# Patient Record
Sex: Female | Born: 1990 | Race: Black or African American | Hispanic: No | Marital: Single | Smoking: Never smoker
Health system: Southern US, Community
[De-identification: ages and names within clinical notes are randomized; demographics above are authoritative.]

## PROBLEM LIST (undated history)

## (undated) DIAGNOSIS — K219 Gastro-esophageal reflux disease without esophagitis: Secondary | ICD-10-CM

## (undated) HISTORY — DX: Gastro-esophageal reflux disease without esophagitis: K21.9

---

## 2013-01-12 ENCOUNTER — Emergency Department (HOSPITAL_COMMUNITY)
Admission: EM | Admit: 2013-01-12 | Discharge: 2013-01-13 | Disposition: A | Discharge status: Home / Self Care | Payer: Federal, State, Local not specified - PPO | Attending: Emergency Medicine | Admitting: Emergency Medicine

## 2013-01-12 ENCOUNTER — Encounter (HOSPITAL_COMMUNITY): Payer: Self-pay | Admitting: *Deleted

## 2013-01-12 DIAGNOSIS — B9689 Other specified bacterial agents as the cause of diseases classified elsewhere: Secondary | ICD-10-CM

## 2013-01-12 DIAGNOSIS — N760 Acute vaginitis: Secondary | ICD-10-CM

## 2013-01-12 DIAGNOSIS — Z3202 Encounter for pregnancy test, result negative: Secondary | ICD-10-CM | POA: Insufficient documentation

## 2013-01-12 DIAGNOSIS — B373 Candidiasis of vulva and vagina: Secondary | ICD-10-CM | POA: Insufficient documentation

## 2013-01-12 DIAGNOSIS — N3941 Urge incontinence: Secondary | ICD-10-CM | POA: Insufficient documentation

## 2013-01-12 DIAGNOSIS — N390 Urinary tract infection, site not specified: Secondary | ICD-10-CM

## 2013-01-12 DIAGNOSIS — R350 Frequency of micturition: Secondary | ICD-10-CM | POA: Insufficient documentation

## 2013-01-12 DIAGNOSIS — B3731 Acute candidiasis of vulva and vagina: Secondary | ICD-10-CM

## 2013-01-12 LAB — WET PREP, GENITAL: Trich, Wet Prep: NONE SEEN

## 2013-01-12 LAB — URINALYSIS, ROUTINE W REFLEX MICROSCOPIC
Bilirubin Urine: NEGATIVE
Ketones, ur: 40 mg/dL — AB
Nitrite: NEGATIVE
Protein, ur: NEGATIVE mg/dL
Specific Gravity, Urine: 1.022 (ref 1.005–1.030)
Urobilinogen, UA: 0.2 mg/dL (ref 0.0–1.0)

## 2013-01-12 NOTE — ED Notes (Signed)
The pt has had a rash on her vagina and or her labia for one month intermittently.  No itching  Some discomfort .  lmp  March  31st

## 2013-01-12 NOTE — ED Notes (Signed)
Pt reports perineal rash since this past Monday. States that she applied monistat cream and took Ibuprofen for the burning sensation. Denies pain at this time. Reports that she had same type of rash with her menstrual cycle last month and is about to start her menstrual cycle this week. Denies dysuria, abdominal pain, nausea, vomiting, diarrhea. Reports clear vaginal discharge.

## 2013-01-13 MED ORDER — PHENAZOPYRIDINE HCL 200 MG PO TABS: 200.0000 mg | ORAL_TABLET | Freq: Three times a day (TID) | ORAL | Status: DC

## 2013-01-13 MED ORDER — FLUCONAZOLE 100 MG PO TABS
150.0000 mg | ORAL_TABLET | Freq: Once | ORAL | Status: AC
  Administered 2013-01-13: 150 mg via ORAL
  Filled 2013-01-13: qty 2

## 2013-01-13 MED ORDER — METRONIDAZOLE 500 MG PO TABS: 500.0000 mg | ORAL_TABLET | Freq: Two times a day (BID) | ORAL | Status: DC

## 2013-01-13 MED ORDER — CIPROFLOXACIN HCL 250 MG PO TABS: 250.0000 mg | ORAL_TABLET | Freq: Two times a day (BID) | ORAL | Status: DC

## 2013-01-13 NOTE — ED Provider Notes (Signed)
History     CSN: [id number]  Arrival date & time 01/12/13  2032   First MD Initiated Contact with Patient 01/12/13 2128      Chief Complaint  Patient presents with  . Rash    (Consider location/radiation/quality/duration/timing/severity/associated sxs/prior treatment) HPI Patient presents w cc vaginal discomfort. She states that her vulva is very irritated and it burns when she urinates or walks. She has also had frequency and urgency of urination without hmaturia or flank pain. She states that it is uncomfortable and distressing . She denie any vaginal discharge, itching, burning, foul odor or pain. Denies unprotected sex. Denies fevers, chills, myalgias, arthralgias. Denies DOE, SOB, chest tightness or pressure, radiation to left arm, jaw or back, or diaphoresis. Denies headaches, light headedness, weakness, visual disturbances. Denies abdominal pain, nausea, vomiting, diarrhea or constipation.    History reviewed. No pertinent past medical history.  History reviewed. No pertinent past surgical history.  No family history on file.  History  Substance Use Topics  . Smoking status: Never Smoker   . Smokeless tobacco: Not on file  . Alcohol Use: No    OB History   Grav Para Term Preterm Abortions TAB SAB Ect Mult Living                  Review of Systems Ten systems reviewed and are negative for acute change, except as noted in the HPI.   Allergies  Review of patient's allergies indicates not on file.  Home Medications   Current Outpatient Rx  Name  Route  Sig  Dispense  Refill  . ibuprofen (ADVIL,MOTRIN) 200 MG tablet   Oral   Take 400 mg by mouth every 8 (eight) hours as needed for pain.         . miconazole (MICOTIN) 2 % cream   Topical   Apply 1 application topically 2 (two) times daily.           BP 142/79  Pulse 105  Temp(Src) 99 F (37.2 C) (Oral)  Resp 20  Ht 5' 5.5" (1.664 m)  Wt 215 lb (97.523 kg)  BMI 35.22 kg/m2  SpO2 100%  LMP  12/12/2012  Physical Exam  Constitutional: She is oriented to person, place, and time. She appears well-developed and well-nourished. No distress.  HENT:  Head: Normocephalic and atraumatic.  Eyes: Conjunctivae are normal. No scleral icterus.  Neck: Normal range of motion.  Cardiovascular: Normal rate, regular rhythm and normal heart sounds.  Exam reveals no gallop and no friction rub.   No murmur heard. Pulmonary/Chest: Effort normal and breath sounds normal. No respiratory distress.  Abdominal: Soft. Bowel sounds are normal. She exhibits no distension and no mass. There is no tenderness. There is no guarding.  Genitourinary:    Pelvic exam was performed with patient supine. No labial fusion. There is tenderness on the right labia. There is no rash, lesion or injury on the right labia. There is tenderness on the left labia. There is no rash, lesion or injury on the left labia. No erythema, tenderness or bleeding around the vagina. No foreign body around the vagina. No signs of injury around the vagina. Vaginal discharge found.  Neurological: She is alert and oriented to person, place, and time.  Skin: Skin is warm and dry. She is not diaphoretic.    ED Course  Procedures (including critical care time)  Labs Reviewed  WET PREP, GENITAL - Abnormal; Notable for the following:    Yeast Wet Prep HPF POC  RARE (*)    Clue Cells Wet Prep HPF POC MANY (*)    WBC, Wet Prep HPF POC FEW (*)    All other components within normal limits  URINALYSIS, ROUTINE W REFLEX MICROSCOPIC - Abnormal; Notable for the following:    APPearance CLOUDY (*)    Hgb urine dipstick TRACE (*)    Ketones, ur 40 (*)    Leukocytes, UA TRACE (*)    All other components within normal limits  URINE MICROSCOPIC-ADD ON - Abnormal; Notable for the following:    Squamous Epithelial / LPF FEW (*)    Bacteria, UA FEW (*)    All other components within normal limits  GC/CHLAMYDIA PROBE AMP  PREGNANCY, URINE   No results  found.   1. UTI (lower urinary tract infection)   2. Vulvovaginitis   3. BV (bacterial vaginosis)   4. Yeast infection of the vagina       MDM  Patient labs show uti, bv/yeast infectin. I do not suspect any PID.  No cervicitis and nontender on exam. G/c chlamydia pending. patietn tachycardicinitially. States she is very nervous. HR trending down. The patient appears reasonably screened and/or stabilized for discharge and I doubt any other medical condition or other [facility] requiring further screening, evaluation, or treatment in the ED at this time prior to discharge. F/u with Lajean Saver, PA-C 01/15/13 2124

## 2013-01-14 LAB — GC/CHLAMYDIA PROBE AMP
CT Probe RNA: NEGATIVE
GC Probe RNA: NEGATIVE

## 2013-01-16 NOTE — ED Provider Notes (Signed)
Medical screening examination/treatment/procedure(s) were performed by non-physician practitioner and as supervising physician I was immediately available for consultation/collaboration.   Carleene Cooper III, MD 01/16/13 [phone]

## 2014-01-02 ENCOUNTER — Ambulatory Visit (INDEPENDENT_AMBULATORY_CARE_PROVIDER_SITE_OTHER): Payer: Federal, State, Local not specified - PPO | Admitting: Family Medicine

## 2014-01-02 ENCOUNTER — Ambulatory Visit: Payer: Federal, State, Local not specified - PPO

## 2014-01-02 VITALS — BP 120/68 | HR 91 | Temp 98.7°F | Resp 18 | Ht 64.5 in | Wt 231.0 lb

## 2014-01-02 DIAGNOSIS — K219 Gastro-esophageal reflux disease without esophagitis: Secondary | ICD-10-CM

## 2014-01-02 DIAGNOSIS — R109 Unspecified abdominal pain: Secondary | ICD-10-CM

## 2014-01-02 DIAGNOSIS — R1011 Right upper quadrant pain: Secondary | ICD-10-CM

## 2014-01-02 LAB — POCT URINALYSIS DIPSTICK
Bilirubin, UA: NEGATIVE
Blood, UA: NEGATIVE
Glucose, UA: NEGATIVE
Ketones, UA: NEGATIVE
Leukocytes, UA: NEGATIVE
Nitrite, UA: NEGATIVE
Protein, UA: NEGATIVE
Spec Grav, UA: 1.01
Urobilinogen, UA: 0.2
pH, UA: 5.5

## 2014-01-02 LAB — POCT CBC
Granulocyte percent: 53 %G (ref 37–80)
HCT, POC: 41.8 % (ref 37.7–47.9)
Hemoglobin: 13.3 g/dL (ref 12.2–16.2)
Lymph, poc: 2.9 (ref 0.6–3.4)
MCH, POC: 25.3 pg — AB (ref 27–31.2)
MCHC: 31.8 g/dL (ref 31.8–35.4)
MCV: 79.5 fL — AB (ref 80–97)
MID (cbc): 0.7 (ref 0–0.9)
MPV: 8.2 fL (ref 0–99.8)
POC Granulocyte: 4 (ref 2–6.9)
POC LYMPH PERCENT: 37.7 % (ref 10–50)
POC MID %: 9.3 % (ref 0–12)
Platelet Count, POC: 410 10*3/uL (ref 142–424)
RBC: 5.26 M/uL (ref 4.04–5.48)
RDW, POC: 15 %
WBC: 7.6 10*3/uL (ref 4.6–10.2)

## 2014-01-02 LAB — POCT UA - MICROSCOPIC ONLY
Bacteria, U Microscopic: NEGATIVE
Casts, Ur, LPF, POC: NEGATIVE
Crystals, Ur, HPF, POC: NEGATIVE
Mucus, UA: NEGATIVE
RBC, urine, microscopic: NEGATIVE
Yeast, UA: NEGATIVE

## 2014-01-02 MED ORDER — PANTOPRAZOLE SODIUM 20 MG PO TBEC: 20.0000 mg | DELAYED_RELEASE_TABLET | Freq: Every day | ORAL | Status: DC

## 2014-01-02 NOTE — Patient Instructions (Signed)

## 2014-01-02 NOTE — Progress Notes (Signed)
Chief Complaint:  Chief Complaint  Patient presents with  . Abdominal Pain    1 week- no diarrhea no vomitng    HPI: Heather Mccarthy is a 23 y.o. female who is here for 1 week history of ruq abd discomfort , mostly at night. She has had similar symptoms in the past, in October.  She has also been told she had GERD. RUQ abd pain does not hurt but  It is uncomfortable if she is laying on the right side and on her stomach. Denies any diarrhea,  nausea, vomiting, urinary  sxs. Not assoicated with food, Does not matter what foods she eats. Denies and y fevers or chills. Does not feel like a urinary tract infection, deneis vaginal dc, STDs. She drinks alcohol mostly on the weekneds, 2 mix drinks She denies having high cholesterol but has not checked. No family history of gallstones. Denies any blood in urine or stool Deneis any new foods, meds, or recent travels , no abd surgeries Oral intake has been normal, denies any h/o ulcers, renal stones. Has had normal BM this AM, has had gas  Past Medical History  Diagnosis Date  . GERD (gastroesophageal reflux disease)    No past surgical history on file. History   Social History  . Marital Status: Single    Spouse Name: N/A    Number of Children: N/A  . Years of Education: N/A   Social History Main Topics  . Smoking status: Never Smoker   . Smokeless tobacco: None  . Alcohol Use: 1.0 oz/week    2 drink(s) per week  . Drug Use: No  . Sexual Activity: None   Other Topics Concern  . None   Social History Narrative  . None   Family History  Problem Relation Age of Onset  . Hyperlipidemia Father    No Known Allergies Prior to Admission medications   Medication Sig Start Date End Date Taking? Authorizing Provider  ibuprofen (ADVIL,MOTRIN) 200 MG tablet Take 400 mg by mouth every 8 (eight) hours as needed for pain.    Historical Provider, MD     ROS: The patient denies fevers, chills, night sweats, unintentional weight loss,  chest pain, palpitations, wheezing, dyspnea on exertion, nausea, vomiting,  dysuria, hematuria, melena, numbness, weakness, or tingling.   All other systems have been reviewed and were otherwise negative with the exception of those mentioned in the HPI and as above.    PHYSICAL EXAM: Filed Vitals:   01/02/14 1833  BP: 120/68  Pulse: 91  Temp: 98.7 F (37.1 C)  Resp: 18   Filed Vitals:   01/02/14 1833  Height: 5' 4.5" (1.638 m)  Weight: 231 lb (104.781 kg)   Body mass index is 39.05 kg/(m^2).  General: Alert, no acute distress HEENT:  Normocephalic, atraumatic, oropharynx patent. EOMI, PERRLA Cardiovascular:  Regular rate and rhythm, no rubs murmurs or gallops.  No Carotid bruits, radial pulse intact. No pedal edema.  Respiratory: Clear to auscultation bilaterally.  No wheezes, rales, or rhonchi.  No cyanosis, no use of accessory musculature GI: No organomegaly, abdomen is soft and non-tender, positive bowel sounds.  No masses. Non acute abd Skin: No rashes. Neurologic: Facial musculature symmetric. Psychiatric: Patient is appropriate throughout our interaction. Lymphatic: No cervical lymphadenopathy Musculoskeletal: Gait intact.   LABS: Results for orders placed in visit on 01/02/14  POCT CBC      Result Value Ref Range   WBC 7.6  4.6 - 10.2 K/uL  Lymph, poc 2.9  0.6 - 3.4   POC LYMPH PERCENT 37.7  10 - 50 %L   MID (cbc) 0.7  0 - 0.9   POC MID % 9.3  0 - 12 %M   POC Granulocyte 4.0  2 - 6.9   Granulocyte percent 53.0  37 - 80 %G   RBC 5.26  4.04 - 5.48 M/uL   Hemoglobin 13.3  12.2 - 16.2 g/dL   HCT, POC 28.441.8  13.237.7 - 47.9 %   MCV 79.5 (*) 80 - 97 fL   MCH, POC 25.3 (*) 27 - 31.2 pg   MCHC 31.8  31.8 - 35.4 g/dL   RDW, POC 44.015.0     Platelet Count, POC 410  142 - 424 K/uL   MPV 8.2  0 - 99.8 fL  POCT UA - MICROSCOPIC ONLY      Result Value Ref Range   WBC, Ur, HPF, POC 0-1     RBC, urine, microscopic neg     Bacteria, U Microscopic neg     Mucus, UA neg      Epithelial cells, urine per micros 0-1     Crystals, Ur, HPF, POC neg     Casts, Ur, LPF, POC neg     Yeast, UA neg    POCT URINALYSIS DIPSTICK      Result Value Ref Range   Color, UA yellow     Clarity, UA clear     Glucose, UA neg     Bilirubin, UA neg     Ketones, UA neg     Spec Grav, UA 1.010     Blood, UA neg     pH, UA 5.5     Protein, UA neg     Urobilinogen, UA 0.2     Nitrite, UA neg     Leukocytes, UA Negative       EKG/XRAY:   [facility] Neg xray   ASSESSMENT/PLAN: Encounter Diagnoses  Name Primary?  . Abdominal pain, right upper quadrant Yes  . Flank pain    Will wait for CMP, lipase GERD sxs precautions given, Rx protonix Acute abd abdomen precautions given Rx Protonix since tried otc zantac and prilosec without relief Will consider RUQ US if labs are negative, if sxs persist. I am not sure if this is a GB issues since it is primarily at night when she sleeps and also when she sleeps on her stomach. POssibly GERD vs msk in origin F/u with labs   Gross sideeffects, risk and benefits, and alternatives of medications d/w patient. Patient is aware that all medications have potential sideeffects and we are unable to predict every sideeffect or drug-drug interaction that may occur.  Lenell Antuhao P Le, DO 01/02/2014 8:12 PM

## 2014-01-03 LAB — COMPREHENSIVE METABOLIC PANEL WITH GFR
AST: 16 U/L (ref 0–37)
Alkaline Phosphatase: 89 U/L (ref 39–117)
BUN: 8 mg/dL (ref 6–23)
Glucose, Bld: 86 mg/dL (ref 70–99)
Sodium: 134 meq/L — ABNORMAL LOW (ref 135–145)
Total Bilirubin: 0.4 mg/dL (ref 0.2–1.2)

## 2014-01-03 LAB — COMPREHENSIVE METABOLIC PANEL
ALT: 19 U/L (ref 0–35)
Albumin: 4.2 g/dL (ref 3.5–5.2)
CO2: 24 mEq/L (ref 19–32)
Calcium: 9.6 mg/dL (ref 8.4–10.5)
Chloride: 99 mEq/L (ref 96–112)
Creat: 0.68 mg/dL (ref 0.50–1.10)
Potassium: 3.7 mEq/L (ref 3.5–5.3)
Total Protein: 7.8 g/dL (ref 6.0–8.3)

## 2014-01-03 LAB — LIPASE: Lipase: <10 U/L (ref 0–75)

## 2014-01-06 ENCOUNTER — Telehealth: Payer: Self-pay | Admitting: Family Medicine

## 2014-01-06 NOTE — Telephone Encounter (Signed)
LM re: labs

## 2014-01-07 ENCOUNTER — Telehealth: Payer: Self-pay

## 2014-01-07 NOTE — Telephone Encounter (Signed)
Spoke with patient about labs, she is better on protonix, feels much better. If she still has pain then will get abd US , gave her precautions for RUQ pain with GB disorder.

## 2014-01-07 NOTE — Telephone Encounter (Signed)
PT STATES DR LE JUST CALLED HER AND SHE HAVE ANOTHER QUESTION TO ASK. PLEASE CALL [phone]

## 2014-01-07 NOTE — Telephone Encounter (Signed)
FYI Pt was wondering about taking a OTC stool softener to help with her constipation. Advised her that would be fine as long as used in moderation. She should not use them long term. Suggested increase water and exercise to help with the motility. Pt understands and will call us if she has any further concerns.

## 2014-03-22 ENCOUNTER — Ambulatory Visit (INDEPENDENT_AMBULATORY_CARE_PROVIDER_SITE_OTHER): Payer: Federal, State, Local not specified - PPO | Admitting: Physician Assistant

## 2014-03-22 VITALS — BP 126/66 | HR 91 | Temp 99.0°F | Resp 18 | Ht 66.0 in | Wt 234.4 lb

## 2014-03-22 DIAGNOSIS — N898 Other specified noninflammatory disorders of vagina: Secondary | ICD-10-CM

## 2014-03-22 DIAGNOSIS — L293 Anogenital pruritus, unspecified: Secondary | ICD-10-CM

## 2014-03-22 LAB — POCT WET PREP WITH KOH
CLUE CELLS WET PREP PER HPF POC: NEGATIVE
KOH Prep POC: NEGATIVE
TRICHOMONAS UA: NEGATIVE
YEAST WET PREP PER HPF POC: NEGATIVE

## 2014-03-22 MED ORDER — METRONIDAZOLE 500 MG PO TABS: 500.0000 mg | ORAL_TABLET | Freq: Two times a day (BID) | ORAL | Status: DC

## 2014-03-22 NOTE — Progress Notes (Signed)
Subjective:    Patient ID: Heather Mccarthy, female    DOB: [birth year], 23 y.o.   MRN: [id number]  HPI   Heather Mccarthy is a very pleasant 23 yr old female here for vaginal discomfort and itching.  This started 4 days ago.  She denies discharge or odor.  Reports she is prone to yeast infections and has had BV in the past - this feels like prior episodes of BV.  She denies abd pain, NV, FC, or urinary symptoms.  She is currently sexually active with 1 female partner.  She reports 100% condom use.  She was previously on OCPs but just stopped taking them - she is not sure why.  She does have refills left on her OCP.  She is not trying to conceive.  LMP 02/26/14.  She has no concern for STI and declines testing today.   Review of Systems  Constitutional: Negative for fever and chills.  Respiratory: Negative.   Cardiovascular: Negative.   Gastrointestinal: Negative for nausea, vomiting and abdominal pain.  Genitourinary: Negative for dysuria, frequency, hematuria, flank pain, vaginal discharge, menstrual problem and dyspareunia.  Musculoskeletal: Negative.   Skin: Negative.        Objective:   Physical Exam  Vitals reviewed. Constitutional: She is oriented to person, place, and time. She appears well-developed and well-nourished. No distress.  HENT:  Head: Normocephalic and atraumatic.  Eyes: Conjunctivae are normal. No scleral icterus.  Cardiovascular: Normal rate, regular rhythm and normal heart sounds.   Pulmonary/Chest: Effort normal and breath sounds normal. She has no wheezes. She has no rales.  Abdominal: Soft. Bowel sounds are normal. There is no tenderness.  Genitourinary: There is no rash, tenderness or lesion on the right labia. There is no rash, tenderness or lesion on the left labia. Cervix exhibits no motion tenderness. Right adnexum displays no mass, no tenderness and no fullness. Left adnexum displays no mass, no tenderness and no fullness. Vaginal discharge (moderate amount of  white/yellow discharge) found.  Vaginal odor present  Neurological: She is alert and oriented to person, place, and time.  Skin: Skin is warm and dry.  Psychiatric: She has a normal mood and affect. Her behavior is normal.    Results for orders placed in visit on 03/22/14  POCT WET PREP WITH KOH      Result Value Ref Range   Trichomonas, UA Negative     Clue Cells Wet Prep HPF POC neg     Epithelial Wet Prep HPF POC 1-4     Yeast Wet Prep HPF POC neg     Bacteria Wet Prep HPF POC 3+     RBC Wet Prep HPF POC 0-5     WBC Wet Prep HPF POC 4-10     KOH Prep POC Negative         Assessment & Plan:  Vaginal itching - Plan: POCT Wet Prep with KOH, GC/Chlamydia Probe Amp, metroNIDAZOLE (FLAGYL) 500 MG tablet   Heather Mccarthy is a very pleasant 23 yr old female here with 4 days of vaginal discomfort/itching.  History of BV and yeast.  Wet prep today is neg for clues - but there is 3+ bacteria, and 4-10 leuks.  Because of this I did recommend gc/chlamydia testing to pt.  She had initially declined, but later agreed to testing - a self collected genprobe has been sent.  Given that pt feels similar to previous episodes of BV, will treat empirically with Flagyl.  Will adjust treatment if necessary  based on gc/chlamydia testing  Encourage pt to restart OCP and gave her edu materials on various birth control methods  Pt to call or RTC if worsening or not improving  E. Frances FurbishElizabeth Egan MHS, PA-C [facility] 6/27/20152:17 PM

## 2014-03-22 NOTE — Patient Instructions (Signed)
Take the metronidazole (Flagyl) as directed.  DO NOT DRINK ALCOHOL WHILE TAKING THIS MEDICATION or for 48 hours after your last dose.  I will let you know when the other labs are back and if we need to do anything else based on those  Please let me know if you are worsening or not improving  Consider restarting your birth control to prevent pregnancy    Bacterial Vaginosis Bacterial vaginosis is a vaginal infection that occurs when the normal balance of bacteria in the vagina is disrupted. It results from an overgrowth of certain bacteria. This is the most common vaginal infection in women of childbearing age. Treatment is important to prevent complications, especially in pregnant women, as it can cause a premature delivery. CAUSES  Bacterial vaginosis is caused by an increase in harmful bacteria that are normally present in smaller amounts in the vagina. Several different kinds of bacteria can cause bacterial vaginosis. However, the reason that the condition develops is not fully understood. RISK FACTORS Certain activities or behaviors can put you at an increased risk of developing bacterial vaginosis, including:  Having a new sex partner or multiple sex partners.  Douching.  Using an intrauterine device (IUD) for contraception. Women do not get bacterial vaginosis from toilet seats, bedding, swimming pools, or contact with objects around them. SIGNS AND SYMPTOMS  Some women with bacterial vaginosis have no signs or symptoms. Common symptoms include:  Grey vaginal discharge.  A fishlike odor with discharge, especially after sexual intercourse.  Itching or burning of the vagina and vulva.  Burning or pain with urination. DIAGNOSIS  Your health care Spike Desilets will take a medical history and examine the vagina for signs of bacterial vaginosis. A sample of vaginal fluid may be taken. Your health care Gregor Dershem will look at this sample under a microscope to check for bacteria and abnormal  cells. A vaginal pH test may also be done.  TREATMENT  Bacterial vaginosis may be treated with antibiotic medicines. These may be given in the form of a pill or a vaginal cream. A second round of antibiotics may be prescribed if the condition comes back after treatment.  HOME CARE INSTRUCTIONS   Only take over-the-counter or prescription medicines as directed by your health care Johathon Overturf.  If antibiotic medicine was prescribed, take it as directed. Make sure you finish it even if you start to feel better.  Do not have sex until treatment is completed.  Tell all sexual partners that you have a vaginal infection. They should see their health care Endora Teresi and be treated if they have problems, such as a mild rash or itching.  Practice safe sex by using condoms and only having one sex partner. SEEK MEDICAL CARE IF:   Your symptoms are not improving after 3 days of treatment.  You have increased discharge or pain.  You have a fever. MAKE SURE YOU:   Understand these instructions.  Will watch your condition.  Will get help right away if you are not doing well or get worse. FOR MORE INFORMATION  Centers for Disease Control and Prevention, Division of STD Prevention: SolutionApps.co.zawww.cdc.gov/std American Sexual Health Association (ASHA): www.ashastd.org  Document Released: 09/12/2005 Document Revised: 07/03/2013 Document Reviewed: 04/24/2013 [facility] Patient Information 2015 [place], [place]. This information is not intended to replace advice given to you by your health care Whit Bruni. Make sure you discuss any questions you have with your health care Corrina Steffensen.

## 2014-03-24 LAB — GC/CHLAMYDIA PROBE AMP
CT Probe RNA: POSITIVE — AB
GC Probe RNA: NEGATIVE

## 2014-03-25 ENCOUNTER — Other Ambulatory Visit: Payer: Self-pay | Admitting: Physician Assistant

## 2014-03-25 MED ORDER — AZITHROMYCIN 500 MG PO TABS: 1000.0000 mg | ORAL_TABLET | Freq: Once | ORAL | Status: DC

## 2014-03-26 ENCOUNTER — Telehealth: Payer: Self-pay

## 2014-03-26 NOTE — Telephone Encounter (Signed)
Advised pt that she could stop 1st medication. She understands

## 2014-03-26 NOTE — Telephone Encounter (Signed)
PT HAVE QUESTIONS REGARDING THE MEDICATION SHE WAS PUT ON, THEY HAD HER ON SOMETHING AND THEN CHANGED IT ONCE HER TEST RESULTS CAME BACK DIDN'T KNOW IF SHE SHOULD CONTINUE WITH THEM BOTH OR JUST THE LAST ONE. PLEASE CALL [phone]

## 2014-05-21 ENCOUNTER — Ambulatory Visit (INDEPENDENT_AMBULATORY_CARE_PROVIDER_SITE_OTHER): Payer: Federal, State, Local not specified - PPO | Admitting: Physician Assistant

## 2014-05-21 VITALS — BP 120/68 | HR 83 | Temp 98.6°F | Resp 16 | Ht 66.5 in | Wt 224.2 lb

## 2014-05-21 DIAGNOSIS — N899 Noninflammatory disorder of vagina, unspecified: Secondary | ICD-10-CM

## 2014-05-21 DIAGNOSIS — R350 Frequency of micturition: Secondary | ICD-10-CM

## 2014-05-21 DIAGNOSIS — N898 Other specified noninflammatory disorders of vagina: Secondary | ICD-10-CM

## 2014-05-21 DIAGNOSIS — K219 Gastro-esophageal reflux disease without esophagitis: Secondary | ICD-10-CM | POA: Insufficient documentation

## 2014-05-21 DIAGNOSIS — A499 Bacterial infection, unspecified: Secondary | ICD-10-CM

## 2014-05-21 DIAGNOSIS — N760 Acute vaginitis: Secondary | ICD-10-CM

## 2014-05-21 DIAGNOSIS — B9689 Other specified bacterial agents as the cause of diseases classified elsewhere: Secondary | ICD-10-CM

## 2014-05-21 LAB — POCT WET PREP WITH KOH
KOH Prep POC: NEGATIVE
TRICHOMONAS UA: NEGATIVE
YEAST WET PREP PER HPF POC: NEGATIVE

## 2014-05-21 LAB — POCT URINALYSIS DIPSTICK
BILIRUBIN UA: NEGATIVE
GLUCOSE UA: NEGATIVE
KETONES UA: NEGATIVE
Nitrite, UA: NEGATIVE
Protein, UA: 30
SPEC GRAV UA: 1.02
Urobilinogen, UA: 0.2
pH, UA: 6

## 2014-05-21 LAB — POCT UA - MICROSCOPIC ONLY
Casts, Ur, LPF, POC: NEGATIVE
Crystals, Ur, HPF, POC: NEGATIVE
MUCUS UA: NEGATIVE
YEAST UA: NEGATIVE

## 2014-05-21 MED ORDER — METRONIDAZOLE 500 MG PO TABS: 500.0000 mg | ORAL_TABLET | Freq: Two times a day (BID) | ORAL | Status: DC

## 2014-05-21 NOTE — Progress Notes (Signed)
Subjective:    Patient ID: Heather Mccarthy, female    DOB: 1991/06/30, 23 y.o.   MRN: [id number]   PCP: No PCP Per Patient  Chief Complaint  Patient presents with  . Other    pt states for at least one month she has been having some discomfort around the labia and back towards the buttocks; OTC itch cream with little effect; denies fever/chills and no other sxs    Medications, allergies, past medical history, surgical history, family history, social history and problem list reviewed and updated.  Patient Active Problem List   Diagnosis Date Noted  . GERD (gastroesophageal reflux disease) 05/21/2014    Prior to Admission medications   Medication Sig Start Date End Date Taking? Authorizing Provider  pantoprazole (PROTONIX) 20 MG tablet Take 1 tablet (20 mg total) by mouth daily. 01/02/14  Yes Thao P Le, DO    HPI  Irritation, buttock, "feels like a diaper rash." "I've had a UTI before, that's what it kind of reminds me of." "I have hemorrhoids, too."  "About a month and a half ago I had a bump, and then I shaved, and it's been there ever since.  No vaginal discharge. No burning with urination. "When exposed to the air, it feels like my skin is kind of cracking." Mild increase in urinary frequency, no urgency. No vaginal discharge.  Review of Systems As above.    Objective:   Physical Exam  Constitutional: She is oriented to person, place, and time. She appears well-developed and well-nourished. No distress.  BP 120/68  Pulse 83  Temp(Src) 98.6 F (37 C) (Oral)  Resp 16  Ht 5' 6.5" (1.689 m)  Wt 224 lb 3.2 oz (101.696 kg)  BMI 35.65 kg/m2  SpO2 99%  LMP 04/29/2014   Pulmonary/Chest: Effort normal.  Genitourinary: Rectal exam shows no external hemorrhoid and no fissure.    Pelvic exam was performed with patient supine. Vaginal discharge found.  Lymphadenopathy:       Right: No inguinal adenopathy present.       Left: No inguinal adenopathy present.    Neurological: She is alert and oriented to person, place, and time.  Psychiatric: She has a normal mood and affect. Her behavior is normal.      Results for orders placed in visit on 05/21/14  POCT URINALYSIS DIPSTICK      Result Value Ref Range   Color, UA dark yellow     Clarity, UA cloudy     Glucose, UA neg     Bilirubin, UA neg     Ketones, UA neg     Spec Grav, UA 1.020     Blood, UA small     pH, UA 6.0     Protein, UA 30     Urobilinogen, UA 0.2     Nitrite, UA neg     Leukocytes, UA small (1+)    POCT WET PREP WITH KOH      Result Value Ref Range   Trichomonas, UA Negative     Clue Cells Wet Prep HPF POC 75%     Epithelial Wet Prep HPF POC 15-20     Yeast Wet Prep HPF POC neg     Bacteria Wet Prep HPF POC 4+     RBC Wet Prep HPF POC 1-2     WBC Wet Prep HPF POC 4-5     KOH Prep POC Negative    POCT UA - MICROSCOPIC ONLY  Result Value Ref Range   WBC, Ur, HPF, POC 1-4     RBC, urine, microscopic 1-2     Bacteria, U Microscopic 3+     Mucus, UA neg     Epithelial cells, urine per micros 2-3     Crystals, Ur, HPF, POC neg     Casts, Ur, LPF, POC neg     Yeast, UA neg         Assessment & Plan:  1. Vaginal irritation Secondary to BV. Suspect that she did have a small area of trauma (hair bump that she shaved) that has been extra sensitive. - POCT urinalysis dipstick - POCT Wet Prep with KOH  2. Urinary frequency No evidence of Diabetes. If persists, plan additional evaluation. - POCT UA - Microscopic Only  3. BV (bacterial vaginosis) Anticipatory guidance provided, supportive care. - metroNIDAZOLE (FLAGYL) 500 MG tablet; Take 1 tablet (500 mg total) by mouth 2 (two) times daily with a meal. DO NOT CONSUME ALCOHOL WHILE TAKING THIS MEDICATION.  Dispense: 14 tablet; Refill: 0   Fernande Bras, PA-C Physician Assistant-Certified [facility]

## 2014-06-08 ENCOUNTER — Ambulatory Visit (INDEPENDENT_AMBULATORY_CARE_PROVIDER_SITE_OTHER): Payer: Federal, State, Local not specified - PPO | Admitting: Emergency Medicine

## 2014-06-08 VITALS — BP 122/84 | HR 83 | Temp 98.9°F | Resp 16 | Ht 67.0 in | Wt 224.8 lb

## 2014-06-08 DIAGNOSIS — N760 Acute vaginitis: Secondary | ICD-10-CM

## 2014-06-08 DIAGNOSIS — L293 Anogenital pruritus, unspecified: Secondary | ICD-10-CM

## 2014-06-08 DIAGNOSIS — A499 Bacterial infection, unspecified: Secondary | ICD-10-CM

## 2014-06-08 DIAGNOSIS — N898 Other specified noninflammatory disorders of vagina: Secondary | ICD-10-CM

## 2014-06-08 DIAGNOSIS — B9689 Other specified bacterial agents as the cause of diseases classified elsewhere: Secondary | ICD-10-CM

## 2014-06-08 LAB — POCT WET PREP WITH KOH
Clue Cells Wet Prep HPF POC: 20
KOH PREP POC: NEGATIVE
Trichomonas, UA: NEGATIVE
YEAST WET PREP PER HPF POC: NEGATIVE

## 2014-06-08 MED ORDER — CLINDAMYCIN PHOSPHATE 2 % VA CREA: 1.0000 | TOPICAL_CREAM | Freq: Every day | VAGINAL | Status: DC

## 2014-06-08 NOTE — Patient Instructions (Signed)
Bacterial Vaginosis Bacterial vaginosis is a vaginal infection that occurs when the normal balance of bacteria in the vagina is disrupted. It results from an overgrowth of certain bacteria. This is the most common vaginal infection in women of childbearing age. Treatment is important to prevent complications, especially in pregnant women, as it can cause a premature delivery. CAUSES  Bacterial vaginosis is caused by an increase in harmful bacteria that are normally present in smaller amounts in the vagina. Several different kinds of bacteria can cause bacterial vaginosis. However, the reason that the condition develops is not fully understood. RISK FACTORS Certain activities or behaviors can put you at an increased risk of developing bacterial vaginosis, including:  Having a new sex partner or multiple sex partners.  Douching.  Using an intrauterine device (IUD) for contraception. Women do not get bacterial vaginosis from toilet seats, bedding, swimming pools, or contact with objects around them. SIGNS AND SYMPTOMS  Some women with bacterial vaginosis have no signs or symptoms. Common symptoms include:  Grey vaginal discharge.  A fishlike odor with discharge, especially after sexual intercourse.  Itching or burning of the vagina and vulva.  Burning or pain with urination. DIAGNOSIS  Your health care provider will take a medical history and examine the vagina for signs of bacterial vaginosis. A sample of vaginal fluid may be taken. Your health care provider will look at this sample under a microscope to check for bacteria and abnormal cells. A vaginal pH test may also be done.  TREATMENT  Bacterial vaginosis may be treated with antibiotic medicines. These may be given in the form of a pill or a vaginal cream. A second round of antibiotics may be prescribed if the condition comes back after treatment.  HOME CARE INSTRUCTIONS   Only take over-the-counter or prescription medicines as  directed by your health care provider.  If antibiotic medicine was prescribed, take it as directed. Make sure you finish it even if you start to feel better.  Do not have sex until treatment is completed.  Tell all sexual partners that you have a vaginal infection. They should see their health care provider and be treated if they have problems, such as a mild rash or itching.  Practice safe sex by using condoms and only having one sex partner. SEEK MEDICAL CARE IF:   Your symptoms are not improving after 3 days of treatment.  You have increased discharge or pain.  You have a fever. MAKE SURE YOU:   Understand these instructions.  Will watch your condition.  Will get help right away if you are not doing well or get worse. FOR MORE INFORMATION  Centers for Disease Control and Prevention, Division of STD Prevention: www.cdc.gov/std American Sexual Health Association (ASHA): www.ashastd.org  Document Released: 09/12/2005 Document Revised: 07/03/2013 Document Reviewed: 04/24/2013 Exit[employer] Patient Information 2015 [employer]. This information is not intended to replace advice given to you by your health care provider. Make sure you discuss any questions you have with your health care provider.  

## 2014-06-08 NOTE — Progress Notes (Signed)
[facility] [address] [place] [address] [phone]  Date:  06/08/2014   Name:  Heather Mccarthy   DOB:  23-May-1991   MRN:  [id number]  PCP:  No PCP Per Patient    Chief Complaint: Follow-up   History of Present Illness:  France Lusty is a 23 y.o. very pleasant female patient who presents with the following:  Patient has a history of vaginal itching, previously treated for BV.  Transiently improved and now has resumed following menses. No vaginal discharge.  No fever or chills.  Uses latex condoms. No dysuria, urgency or frequency. No fever or chills.  No back pain. No nausea or vomiting. No improvement with over the counter medications or other home remedies.  Denies other complaint or health concern today.   Patient Active Problem List   Diagnosis Date Noted  . GERD (gastroesophageal reflux disease) 05/21/2014    Past Medical History  Diagnosis Date  . GERD (gastroesophageal reflux disease)     History reviewed. No pertinent past surgical history.  History  Substance Use Topics  . Smoking status: Never Smoker   . Smokeless tobacco: Never Used  . Alcohol Use: 1.0 oz/week    2 drink(s) per week    Family History  Problem Relation Age of Onset  . Hyperlipidemia Father   . Asthma Sister     No Known Allergies  Medication list has been reviewed and updated.  Current Outpatient Prescriptions on File Prior to Visit  Medication Sig Dispense Refill  . metroNIDAZOLE (FLAGYL) 500 MG tablet Take 1 tablet (500 mg total) by mouth 2 (two) times daily with a meal. DO NOT CONSUME ALCOHOL WHILE TAKING THIS MEDICATION.  14 tablet  0  . pantoprazole (PROTONIX) 20 MG tablet Take 1 tablet (20 mg total) by mouth daily.  30 tablet  6   No current facility-administered medications on file prior to visit.    Review of Systems:  As per HPI, otherwise negative.    Physical Examination: Filed Vitals:   06/08/14 1239  BP: 122/84  Pulse: 83   Temp: 98.9 F (37.2 C)  Resp: 16   Filed Vitals:   06/08/14 1239  Height:  (1.702 m)  Weight: 224 lb 12.8 oz (101.969 kg)   Body mass index is 35.2 kg/(m^2). Ideal Body Weight: Weight in (lb) to have BMI = 25: 159.3   GEN: WDWN, NAD, Non-toxic, Alert & Oriented x 3 HEENT: Atraumatic, Normocephalic.  Ears and Nose: No external deformity. EXTR: No clubbing/cyanosis/edema NEURO: Normal gait.  PSYCH: Normally interactive. Conversant. Not depressed or anxious appearing.  Calm demeanor.  ANORECTAL;  Erythematous introitus.  No discharge   Assessment and Plan: bv Clindamycin  Signed,  Phillips Odor, MD   Results for orders placed in visit on 06/08/14  POCT WET PREP WITH KOH      Result Value Ref Range   Trichomonas, UA Negative     Clue Cells Wet Prep HPF POC 20%     Epithelial Wet Prep HPF POC 1-3     Yeast Wet Prep HPF POC neg     Bacteria Wet Prep HPF POC 2+     RBC Wet Prep HPF POC 0-1     WBC Wet Prep HPF POC 1-3     KOH Prep POC Negative

## 2014-12-21 ENCOUNTER — Ambulatory Visit (INDEPENDENT_AMBULATORY_CARE_PROVIDER_SITE_OTHER): Payer: Federal, State, Local not specified - PPO | Admitting: Urgent Care

## 2014-12-21 VITALS — BP 118/66 | HR 97 | Temp 97.0°F | Resp 18 | Ht 66.0 in | Wt 227.0 lb

## 2014-12-21 DIAGNOSIS — H9201 Otalgia, right ear: Secondary | ICD-10-CM

## 2014-12-21 DIAGNOSIS — R059 Cough, unspecified: Secondary | ICD-10-CM

## 2014-12-21 DIAGNOSIS — J029 Acute pharyngitis, unspecified: Secondary | ICD-10-CM

## 2014-12-21 DIAGNOSIS — R05 Cough: Secondary | ICD-10-CM

## 2014-12-21 DIAGNOSIS — J309 Allergic rhinitis, unspecified: Secondary | ICD-10-CM | POA: Diagnosis not present

## 2014-12-21 LAB — POCT CBC
GRANULOCYTE PERCENT: 52.7 % (ref 37–80)
HCT, POC: 41 % (ref 37.7–47.9)
HEMOGLOBIN: 12.1 g/dL — AB (ref 12.2–16.2)
Lymph, poc: 2.6 (ref 0.6–3.4)
MCH, POC: 23.9 pg — AB (ref 27–31.2)
MCHC: 29.6 g/dL — AB (ref 31.8–35.4)
MCV: 80.7 fL (ref 80–97)
MID (CBC): 0.7 (ref 0–0.9)
MPV: 7.1 fL (ref 0–99.8)
PLATELET COUNT, POC: 355 10*3/uL (ref 142–424)
POC GRANULOCYTE: 3.7 (ref 2–6.9)
POC LYMPH %: 37.3 % (ref 10–50)
POC MID %: 10 % (ref 0–12)
RBC: 5.08 M/uL (ref 4.04–5.48)
RDW, POC: 15.8 %
WBC: 7 10*3/uL (ref 4.6–10.2)

## 2014-12-21 MED ORDER — CETIRIZINE HCL 10 MG PO TABS: 10.0000 mg | ORAL_TABLET | Freq: Every day | ORAL | Status: AC

## 2014-12-21 MED ORDER — HYDROCODONE-HOMATROPINE 5-1.5 MG/5ML PO SYRP: 5.0000 mL | ORAL_SOLUTION | Freq: Every evening | ORAL | Status: AC | PRN

## 2014-12-21 MED ORDER — IPRATROPIUM BROMIDE 0.03 % NA SOLN: 2.0000 | Freq: Two times a day (BID) | NASAL | Status: AC

## 2014-12-21 MED ORDER — PSEUDOEPHEDRINE HCL ER 120 MG PO TB12: 120.0000 mg | ORAL_TABLET | Freq: Two times a day (BID) | ORAL | Status: AC

## 2014-12-21 NOTE — Progress Notes (Signed)
MRN: [id number] DOB: 08/21/1991  Subjective:   Heather Mccarthy is a 24 y.o. female presenting for chief complaint of Ear Pain; Cough; and Nasal Congestion  Reports 1 week history of cold symptoms including dry cough, sore throat, nasal congestion, woke up this am with right ear pain and sinus pain. Has not tried any medication. Denies fevers, ear drainage, itchy or watery red eyes, tooth pain, chest pain, chest tightness, shob, wheezing, n/v, abdominal pain, myalgia. No sick contacts. Of note, patient reports having frequent strep throat infections, used to get Strep every year, last one in 2014. Denies history of seasonal allergies or asthma. Did not get flu shot this year. Denies smoking, drinks 1 glass of wine a week. Denies any other aggravating or relieving factors, no other questions or concerns.  Heather Mccarthy has a current medication list which includes the following prescription(s): pantoprazole. She has No Known Allergies.  Heather Mccarthy  has a past medical history of GERD (gastroesophageal reflux disease). Also  has no past surgical history on file.  ROS As in subjective.  Objective:   Vitals: BP 118/66 mmHg  Pulse 97  Temp(Src) 97 F (36.1 C) (Oral)  Resp 18  Ht  (1.676 m)  Wt 227 lb (102.967 kg)  BMI 36.66 kg/m2  SpO2 97%  LMP 11/27/2014  Physical Exam  Constitutional: She is oriented to person, place, and time and well-developed, well-nourished, and in no distress.  HENT:  TM's flat bilaterally R>L, no effusions or erythema. Nasal turbinates edematous and boggy with slight erythema, clear-yellow rhinorrhea. No sinus tenderness. Postnasal drip present, without oropharyngeal exudates, erythema or abscesses.  Eyes: Conjunctivae are normal. Right eye exhibits no discharge. Left eye exhibits no discharge. No scleral icterus.  Neck: Normal range of motion.  Cardiovascular: Normal rate, regular rhythm and intact distal pulses.  Exam reveals no gallop and no friction rub.   No  murmur heard. Pulmonary/Chest: No stridor. No respiratory distress. She has no wheezes. She has no rales. She exhibits no tenderness.  Lymphadenopathy:    She has no cervical adenopathy.  Neurological: She is alert and oriented to person, place, and time.  Skin: Skin is warm and dry. No rash noted. No erythema. No pallor.  Psychiatric: Mood and affect normal.   Results for orders placed or performed in visit on 12/21/14 (from the past 24 hour(s))  POCT CBC     Status: Abnormal   Collection Time: 12/21/14  3:45 PM  Result Value Ref Range   WBC 7.0 4.6 - 10.2 K/uL   Lymph, poc 2.6 0.6 - 3.4   POC LYMPH PERCENT 37.3 10 - 50 %L   MID (cbc) 0.7 0 - 0.9   POC MID % 10.0 0 - 12 %M   POC Granulocyte 3.7 2 - 6.9   Granulocyte percent 52.7 37 - 80 %G   RBC 5.08 4.04 - 5.48 M/uL   Hemoglobin 12.1 (A) 12.2 - 16.2 g/dL   HCT, POC 40.9 81.1 - 47.9 %   MCV 80.7 80 - 97 fL   MCH, POC 23.9 (A) 27 - 31.2 pg   MCHC 29.6 (A) 31.8 - 35.4 g/dL   RDW, POC 91.4 %   Platelet Count, POC 355 142 - 424 K/uL   MPV 7.1 0 - 99.8 fL   Assessment and Plan :   1. Cough 2. Sore throat 3. Right ear pain 4. Allergic rhinitis, unspecified allergic rhinitis type - Advised patient to start oral antihistamine, Atrovent and Sudafed for nasal  congestion, Hycodan for cough and syrup. - Follow up in 1 week if no improvement in symptoms.  Wallis BambergMario Lashante Fryberger, PA-C [facility] [phone] 12/21/2014 3:29 PM

## 2014-12-21 NOTE — Patient Instructions (Signed)

## 2015-01-05 ENCOUNTER — Other Ambulatory Visit: Payer: Self-pay | Admitting: Family Medicine

## 2015-01-06 NOTE — Telephone Encounter (Signed)
Heather Mccarthy, you mentioned in your OV notes from 12/21/14 that pt has GERD and is on pantoprazole, but since it wasn't the reason you were seeing pt I wanted to make sure it is Ok to give pt RFs?

## 2015-01-07 NOTE — Telephone Encounter (Signed)
Leodis LiverpoolHey Barbara - I did not see her for this. I believe Dr. Conley RollsLe prescribed this for her last. Please send this to her - otherwise, I would need to see her for this in the clinic.  Wallis BambergMario Deryl Ports, PA-C [facility] [phone] 01/07/2015  8:41 AM

## 2015-07-30 IMAGING — CR DG ABDOMEN ACUTE W/ 1V CHEST
3 series · 3 of 3 positions shown · non-contrast
Comparison: None.

CLINICAL DATA: Abdominal pain

EXAM:
ACUTE ABDOMEN SERIES (ABDOMEN 2 VIEW & CHEST 1 VIEW)

[PA]
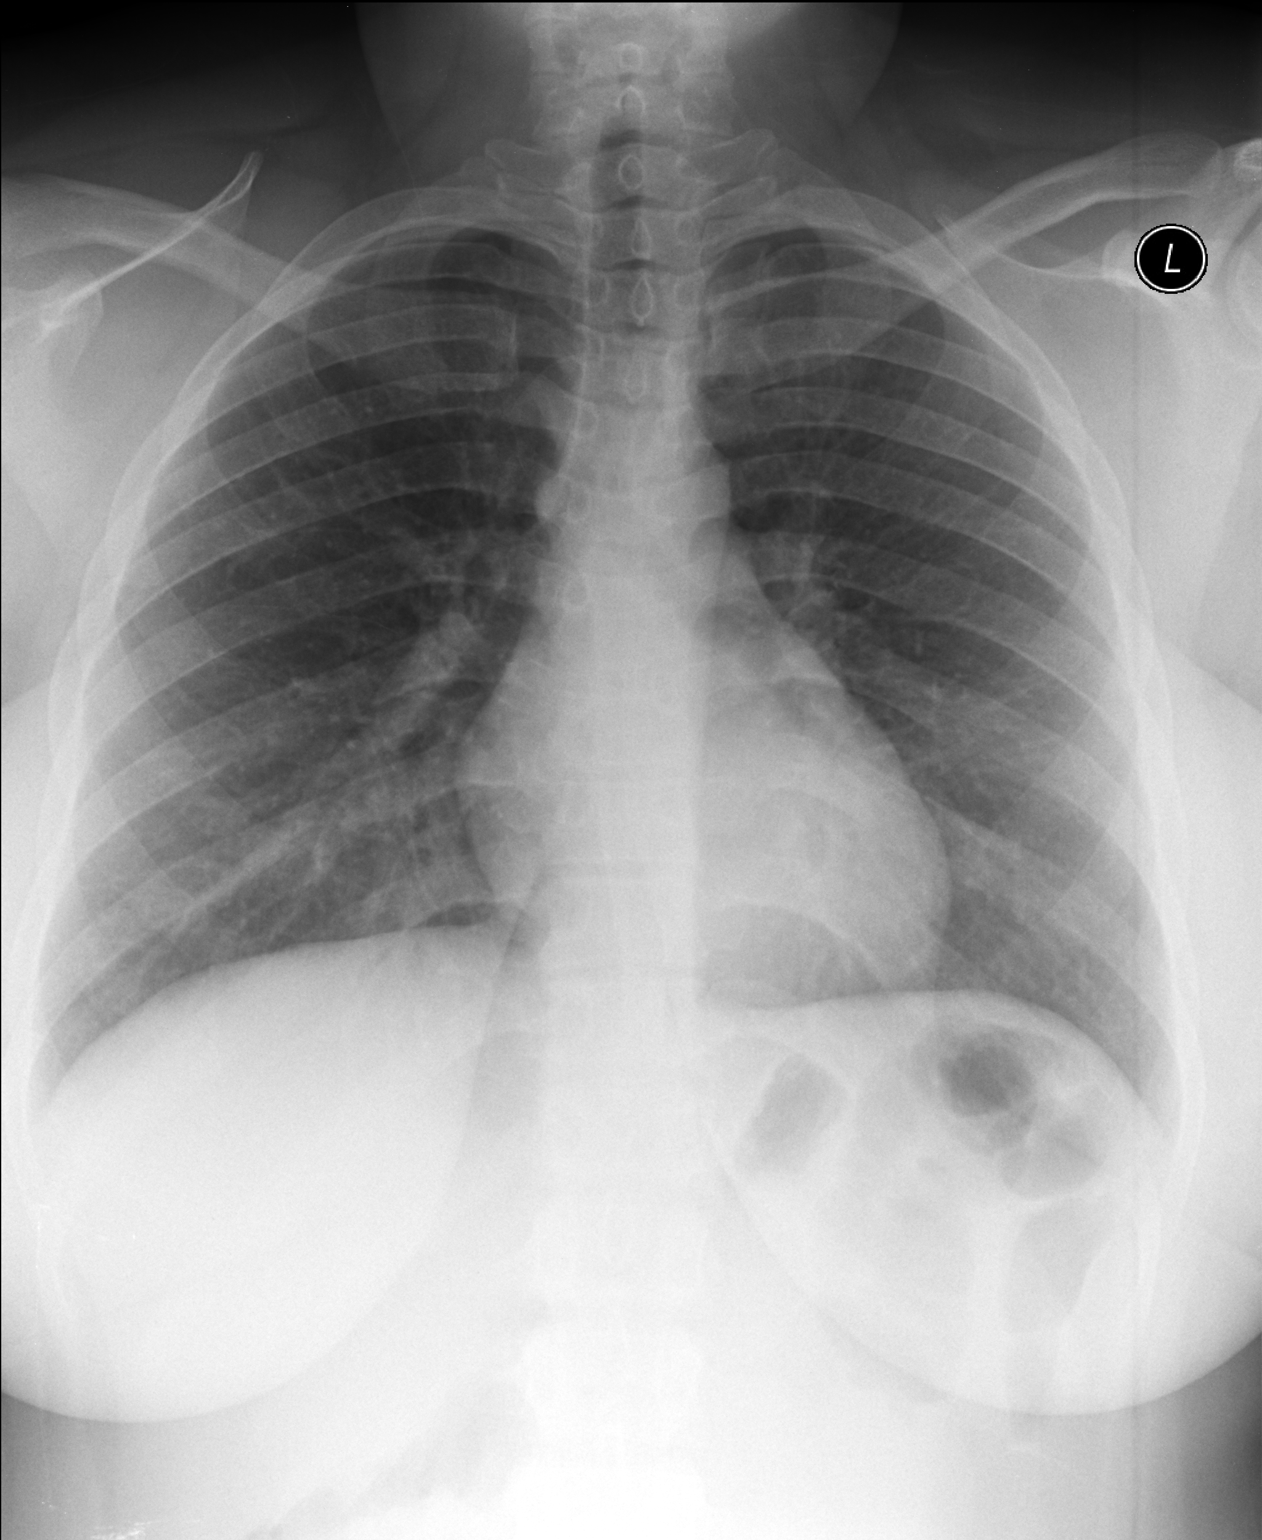

[AP (1 of 2)]
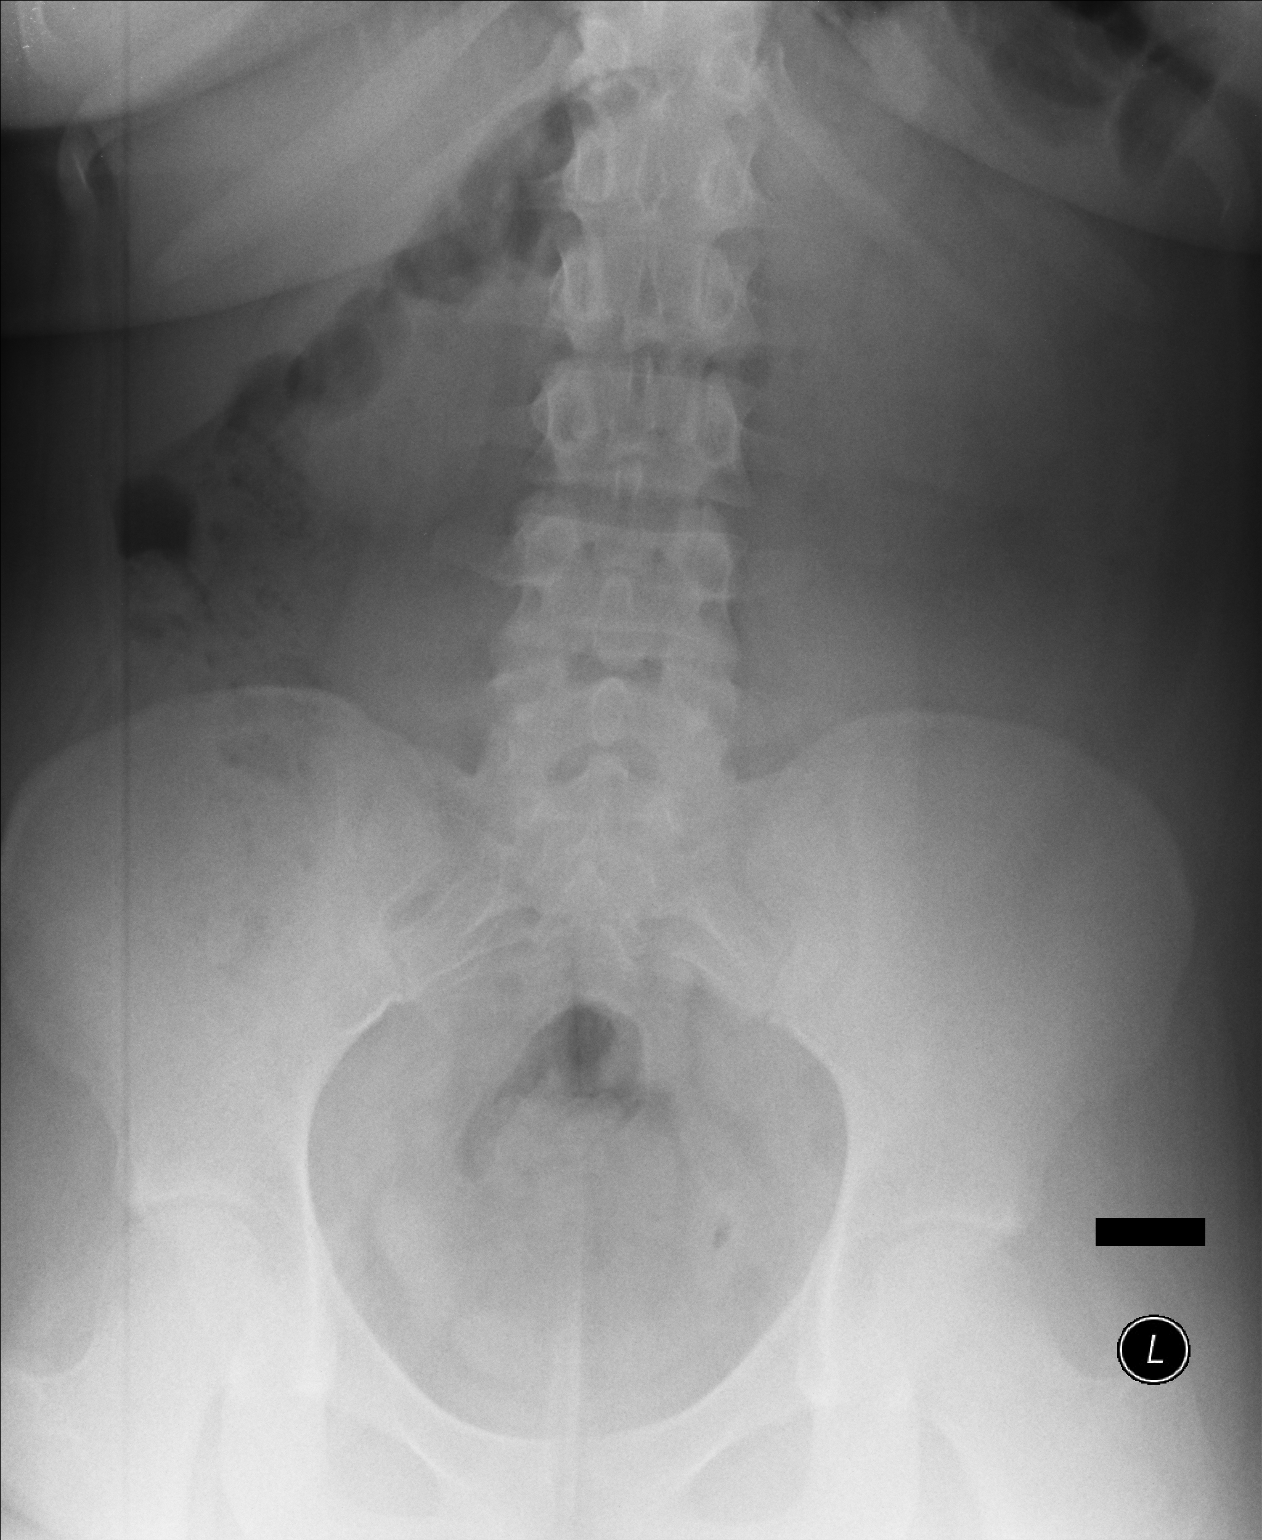

[AP (2 of 2)]
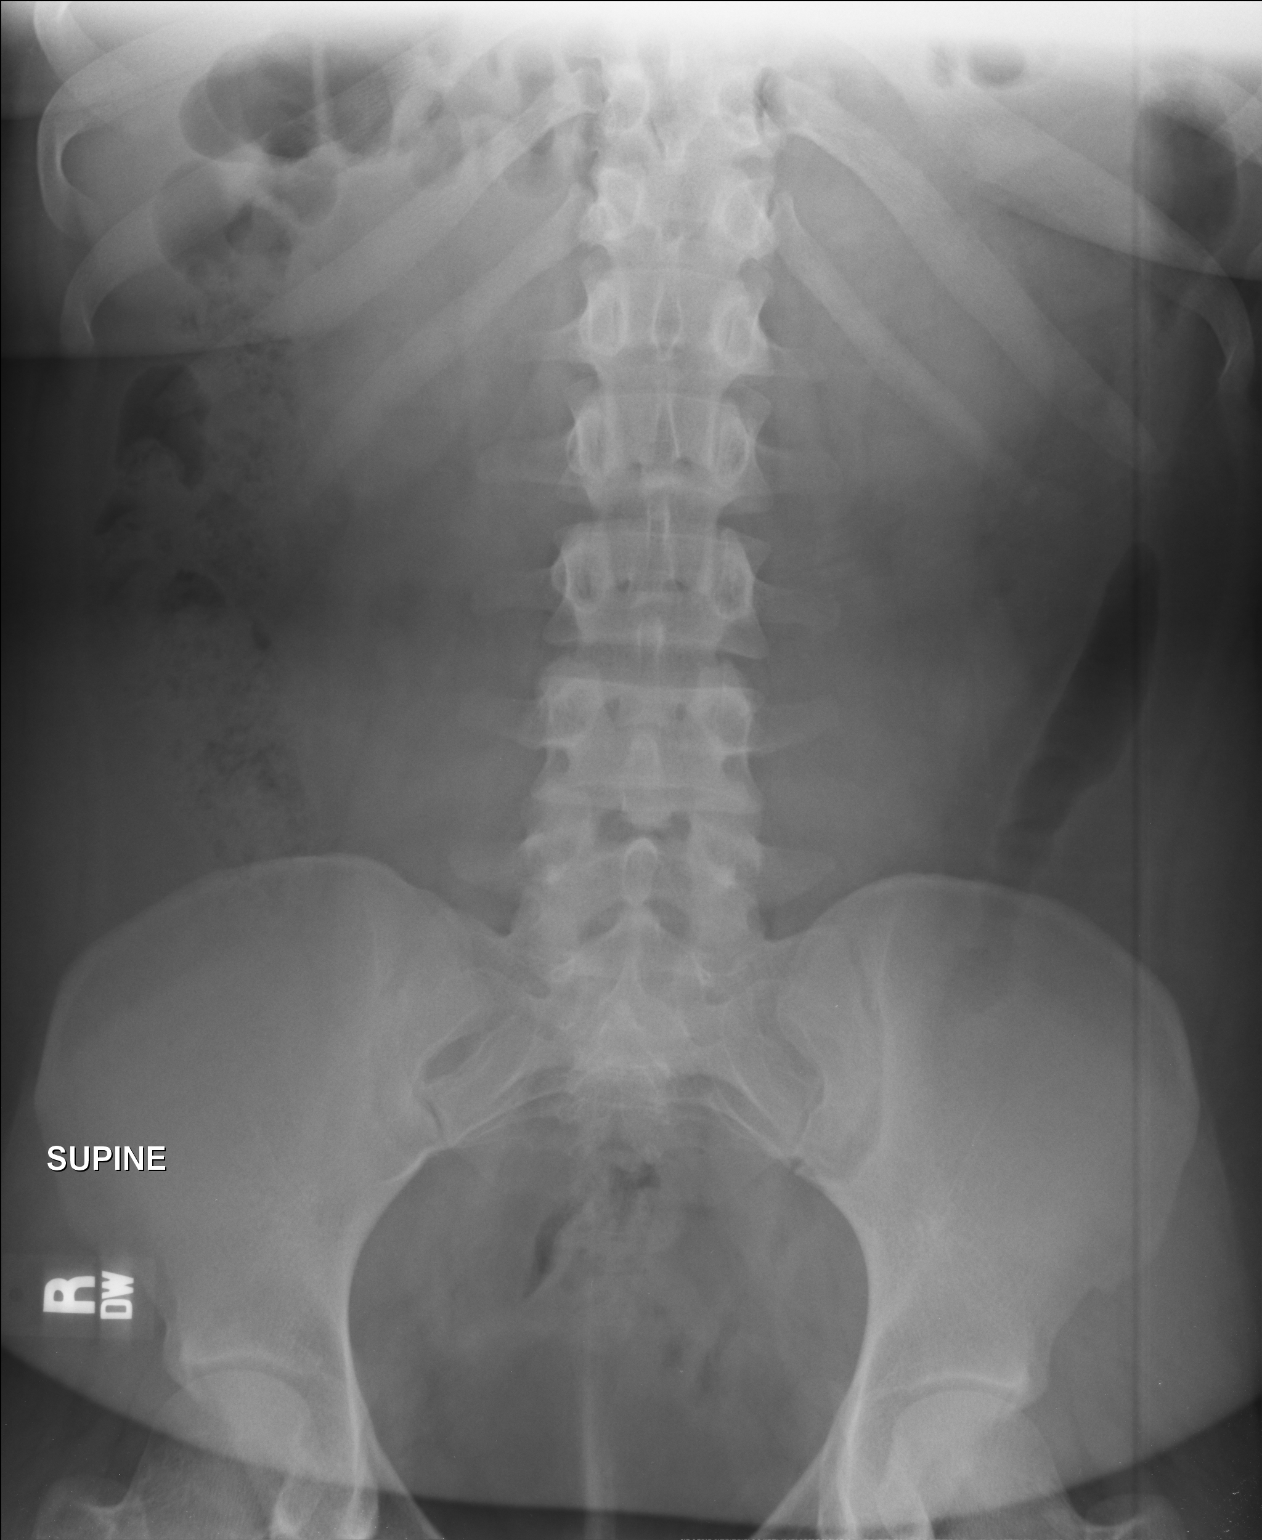

[3 of 3 positions shown; findings below may reference images not displayed]

FINDINGS: PA chest: Lungs are clear. Heart size and pulmonary vascularity are
normal. No adenopathy.

Supine and upright abdomen: There is moderate stool in the colon.
Bowel gas pattern is unremarkable. No obstruction or free air. No
abnormal calcifications.
IMPRESSION: Unremarkable gas pattern.  Lungs clear.
